# Patient Record
Sex: Female | Born: 1980 | Race: White | Marital: Single | Smoking: Never smoker
Health system: Southern US, Community
[De-identification: ages and names within clinical notes are randomized; demographics above are authoritative.]

---

## 2019-02-03 ENCOUNTER — Ambulatory Visit
Admission: RE | Admit: 2019-02-03 | Discharge: 2019-02-03 | Disposition: A | Discharge status: Home / Self Care | Payer: No Typology Code available for payment source | Source: Ambulatory Visit | Attending: Family Medicine | Admitting: Family Medicine

## 2019-02-03 ENCOUNTER — Other Ambulatory Visit: Payer: Self-pay

## 2019-02-03 ENCOUNTER — Ambulatory Visit: Payer: Self-pay | Admitting: Family Medicine

## 2019-02-03 ENCOUNTER — Encounter: Payer: Self-pay | Admitting: Family Medicine

## 2019-02-03 VITALS — BP 104/60 | HR 76 | Temp 98.5°F | Ht 64.0 in | Wt 191.6 lb

## 2019-02-03 DIAGNOSIS — Z0000 Encounter for general adult medical examination without abnormal findings: Secondary | ICD-10-CM

## 2019-02-03 DIAGNOSIS — Y9315 Activity, underwater diving and snorkeling: Secondary | ICD-10-CM

## 2019-02-03 LAB — POCT HEMOGLOBIN: Hemoglobin: 13.5 g/dL (ref 11–14.6)

## 2019-02-03 LAB — POCT UA - GLUCOSE/PROTEIN
GLUCOSE UA: NEGATIVE
Protein, UA: NEGATIVE

## 2019-02-03 LAB — GLUCOSE, POCT (MANUAL RESULT ENTRY): POC Glucose: 86 mg/dl (ref 70–99)

## 2019-02-03 NOTE — Patient Instructions (Signed)
It was great to meet you today!  Go for your chest xray.    I'll let you know the results when you get back.

## 2019-02-03 NOTE — Progress Notes (Signed)
S:    Patient arrives in good spirits and ambulating without assistance.    Presents for lung function evaluation for "dive physical". Patient reports breathing has been good.   Denies history or breathing difficulty.   O: Patient provided good effort while attempting spirometry.  FVC 3.38    Calculated Lower Limit for [employer] Diving Standards   FVC = 3.05 FEV1 2.68       Calculated Lower Limit for [employer] Diving Standards   FEV1= 2.51 FEV1/FVC  79.4   Calculated Lower Limit for [employer] Diving Standards   FEV1/FVC = 73.3  See "scanned report" or Documentation Flowsheet (discrete results - PFTs) for  Spirometry results and copy of evaluation.   A/P: PICK 1 OF THE 2 FOLLOWING (DELETE the others AND THEN DELETE THIS LINE OF TEXT) Spirometry evaluation without bronchodilator reveals normal lung function.  FEV1, FVC and FEV1/FVC ratio all exceed threshold for spirometric parameters. Reviewed results of pulmonary function tests.  Pt verbalized understanding of results and education.

## 2019-02-05 ENCOUNTER — Encounter: Payer: Self-pay | Admitting: Family Medicine

## 2019-02-05 NOTE — Assessment & Plan Note (Addendum)
Vision (distance, near, color), hearing, UA, CBG, Hgb, and Spirometry all within normal limits. Normal CXR within past 5 years -- performed today. EKG:  N/a due age and no cardiac risk factors Coronary assessment:  n/a Approval for SCUBA diving, I find no medical conditions considered incompatible with diving. Due to age and lack of medical conditions, patient qualifies for 2 year certification.

## 2019-02-05 NOTE — Progress Notes (Signed)
Subjective:    Breanna Cunningham is a 38 y.o. female who presents to [facility] today for scuba diving physical for the [facility]:  1.  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  Denies any complications or injuries while diving.  Has never failed a fitness to dive physical.  Currently well, without complaints.  The following portions of the patient's history were reviewed and updated as appropriate: allergies, current medications, past medical history, family and social history, and problem list.  PMH reviewed.  No past medical history on file.  Medications reviewed. No current outpatient medications on file.   No current facility-administered medications for this visit.     PMH:   - denies - No other hospitalizations or other prior medical history   PSH: - denies  Family History: - No family history of syncope or cardiac issues.    Social: - Never smoker - Denies illicit drug use - Very occasional social drinker (1-2 drinks)  SCUBA ROS:  He denies any history of middle ear trauma/disease, vertigo, ocular surgery, asthma or other respiratory issues, seizures, loss of consciousness, recurring neurologic disorders, history of head injury, coagulopathies, evidence of CAD or other structural heart disease, pneumothorax, diabetes, or exercise intolerance.    General ROS:  The patient denies fever, unusual weight change, decreased hearing, chest pain, palpitations, pre-syncopal or syncopal episodes, dyspnea on exertion, prolonged cough, hemoptysis, change in bowel habits, melena, hematochezia, severe indigestion/heartburn, nausea/vomiting/abdominal pain, genital sores, muscle weakness, difficulty walking, abnormal bleeding, or enlarged lymph nodes.     Objective:   Physical Exam BP 104/60   Pulse 76   Temp 98.5 F (36.9 C) (Oral)   Ht 5\' 4"  (1.626 m)   Wt 191 lb 9.6 oz (86.9 kg)   SpO2 97%   BMI 32.89 kg/m  Gen:  Alert,  cooperative patient who appears stated age in no acute distress.  Vital signs reviewed. Head:  /AT Eyes:  Fundoscopy WNL BL.  PERRL, EOMI Ears:  External ears WNL, Bilateral TM's normal without retraction, redness or bulging.  Canals clear BL  Mouth:  Good dental hygiene. Tonsils non-erythematous, non-edematous.   MMM Neck:  Trachea midline Cardiac:  Regular rate and rhythm without murmur auscultated.   Pulm:  Clear to auscultation bilaterally with good air movement throuhout.  No wheezes or rales noted.   Abd:  Soft/nondistended/nontender.  Good bowel sounds throughout all four quadrants.  No masses noted.  Exts: No edema BL LE's, warm and well-perfused Neuro:  Alert and oriented to person, place, and date.  CN II-XII intact.  Sensation intact to light touch and vibration bilateral upper and lower extremities equally.  Motor function equal and strength 5/5 bilateral upper and lower extremities.  Normal gait.  DTRs +2 BL tricep, brachialis, patellar, and achilles.  Finger to nose cerebellar testing within normal limits.  Color vision testing normal. Psych:  Not depressed or anxious appearing.  Linear and coherent thought process as evidenced by speech pattern. Smiles spontaneously.   Results for orders placed or performed in visit on 02/03/19 (from the past 72 hour(s))  Urinalysis - Glucose/Protein     Status: None   Collection Time: 02/03/19  9:05 AM  Result Value Ref Range   Glucose, UA Negative Negative   Protein, UA Negative Negative  Hemoglobin     Status: None   Collection Time: 02/03/19  9:18 AM  Result Value Ref Range   Hemoglobin 13.5 11 - 14.6  g/dL  Glucose (CBG)     Status: None   Collection Time: 02/03/19  9:18 AM  Result Value Ref Range   POC Glucose 86 70 - 99 mg/dl

## 2020-06-14 IMAGING — CR CHEST - 2 VIEW
2 series · 2 of 2 positions shown · non-contrast
Comparison: None.

CLINICAL DATA: Physical examination for scuba diving

EXAM:
CHEST - 2 VIEW

[w chest pa]
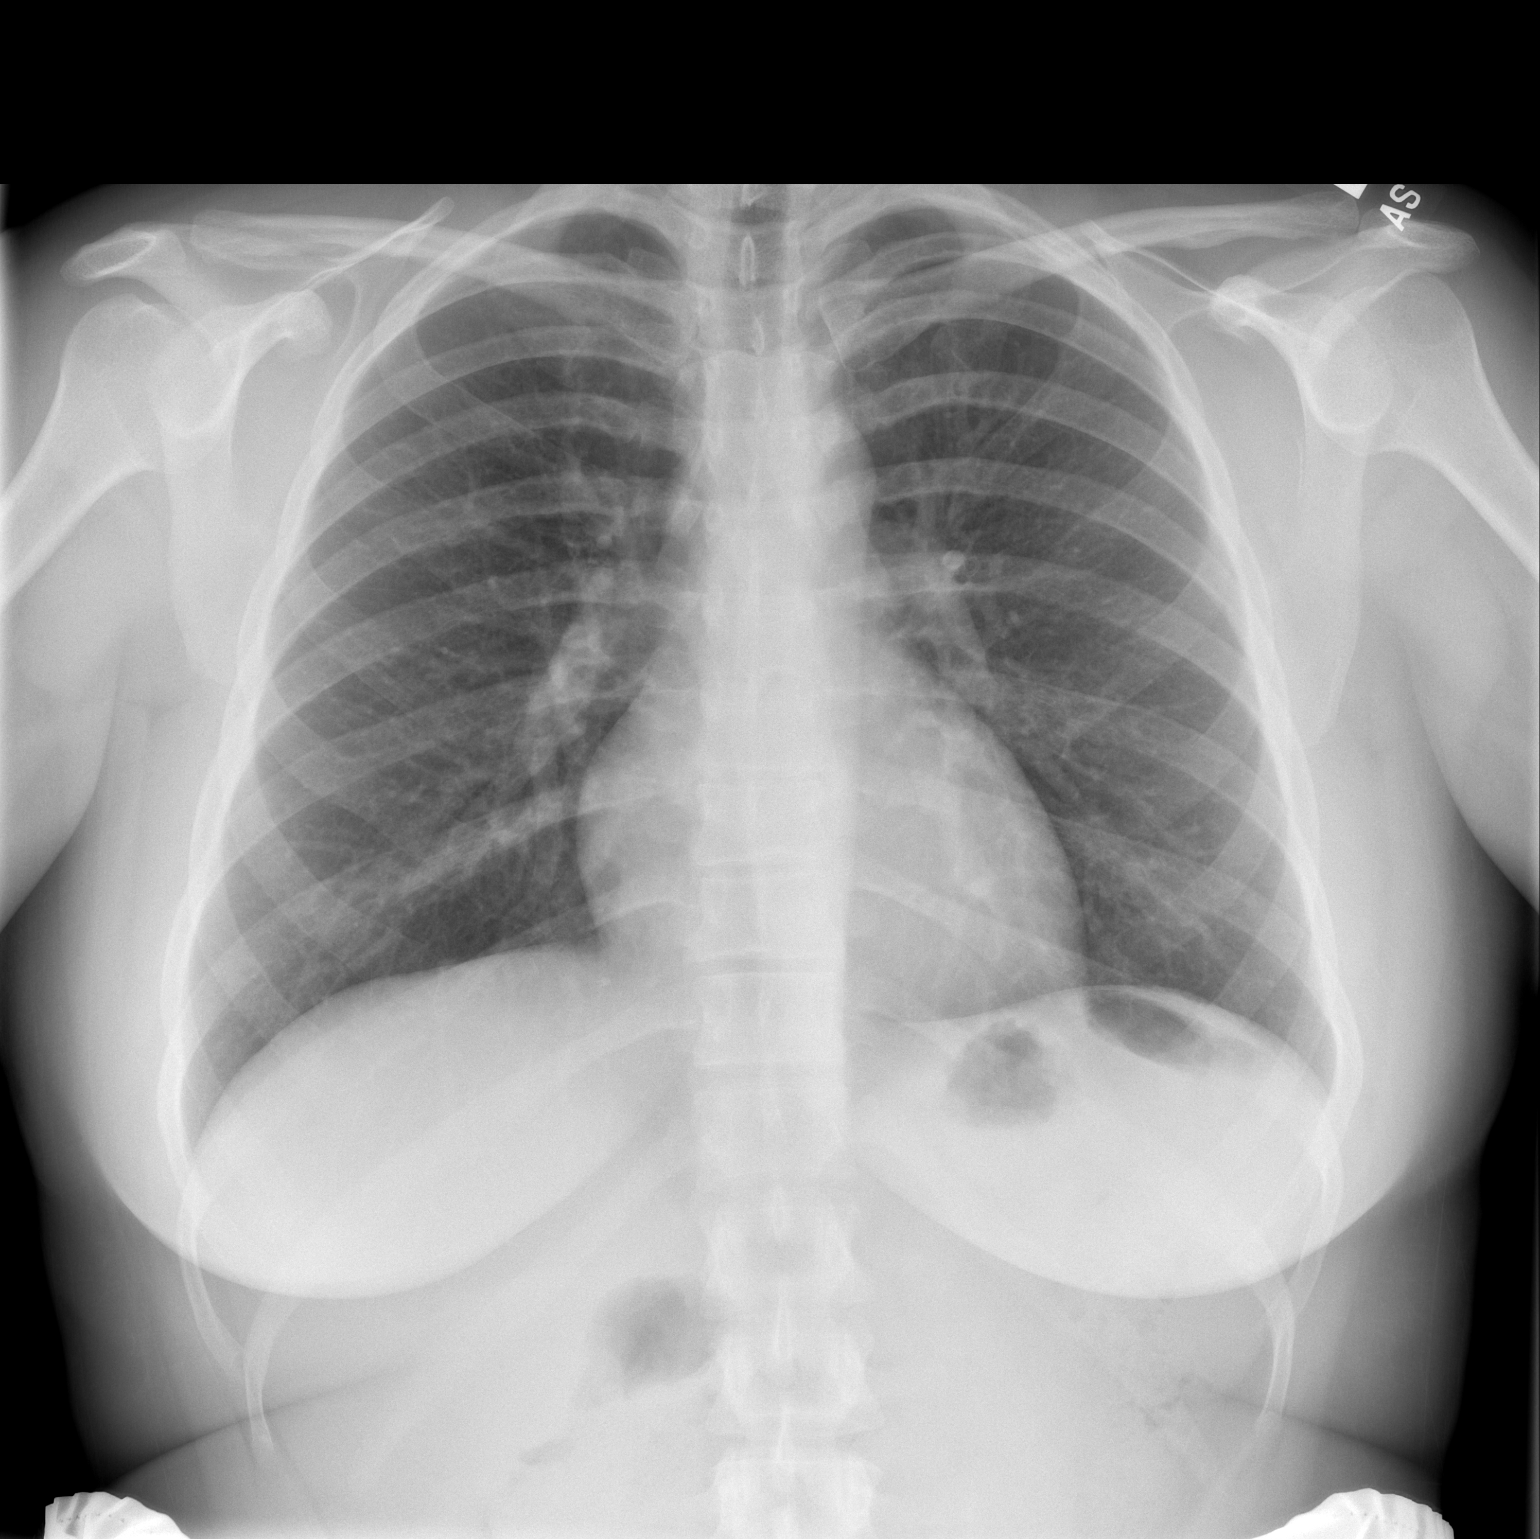

[w chest lat]
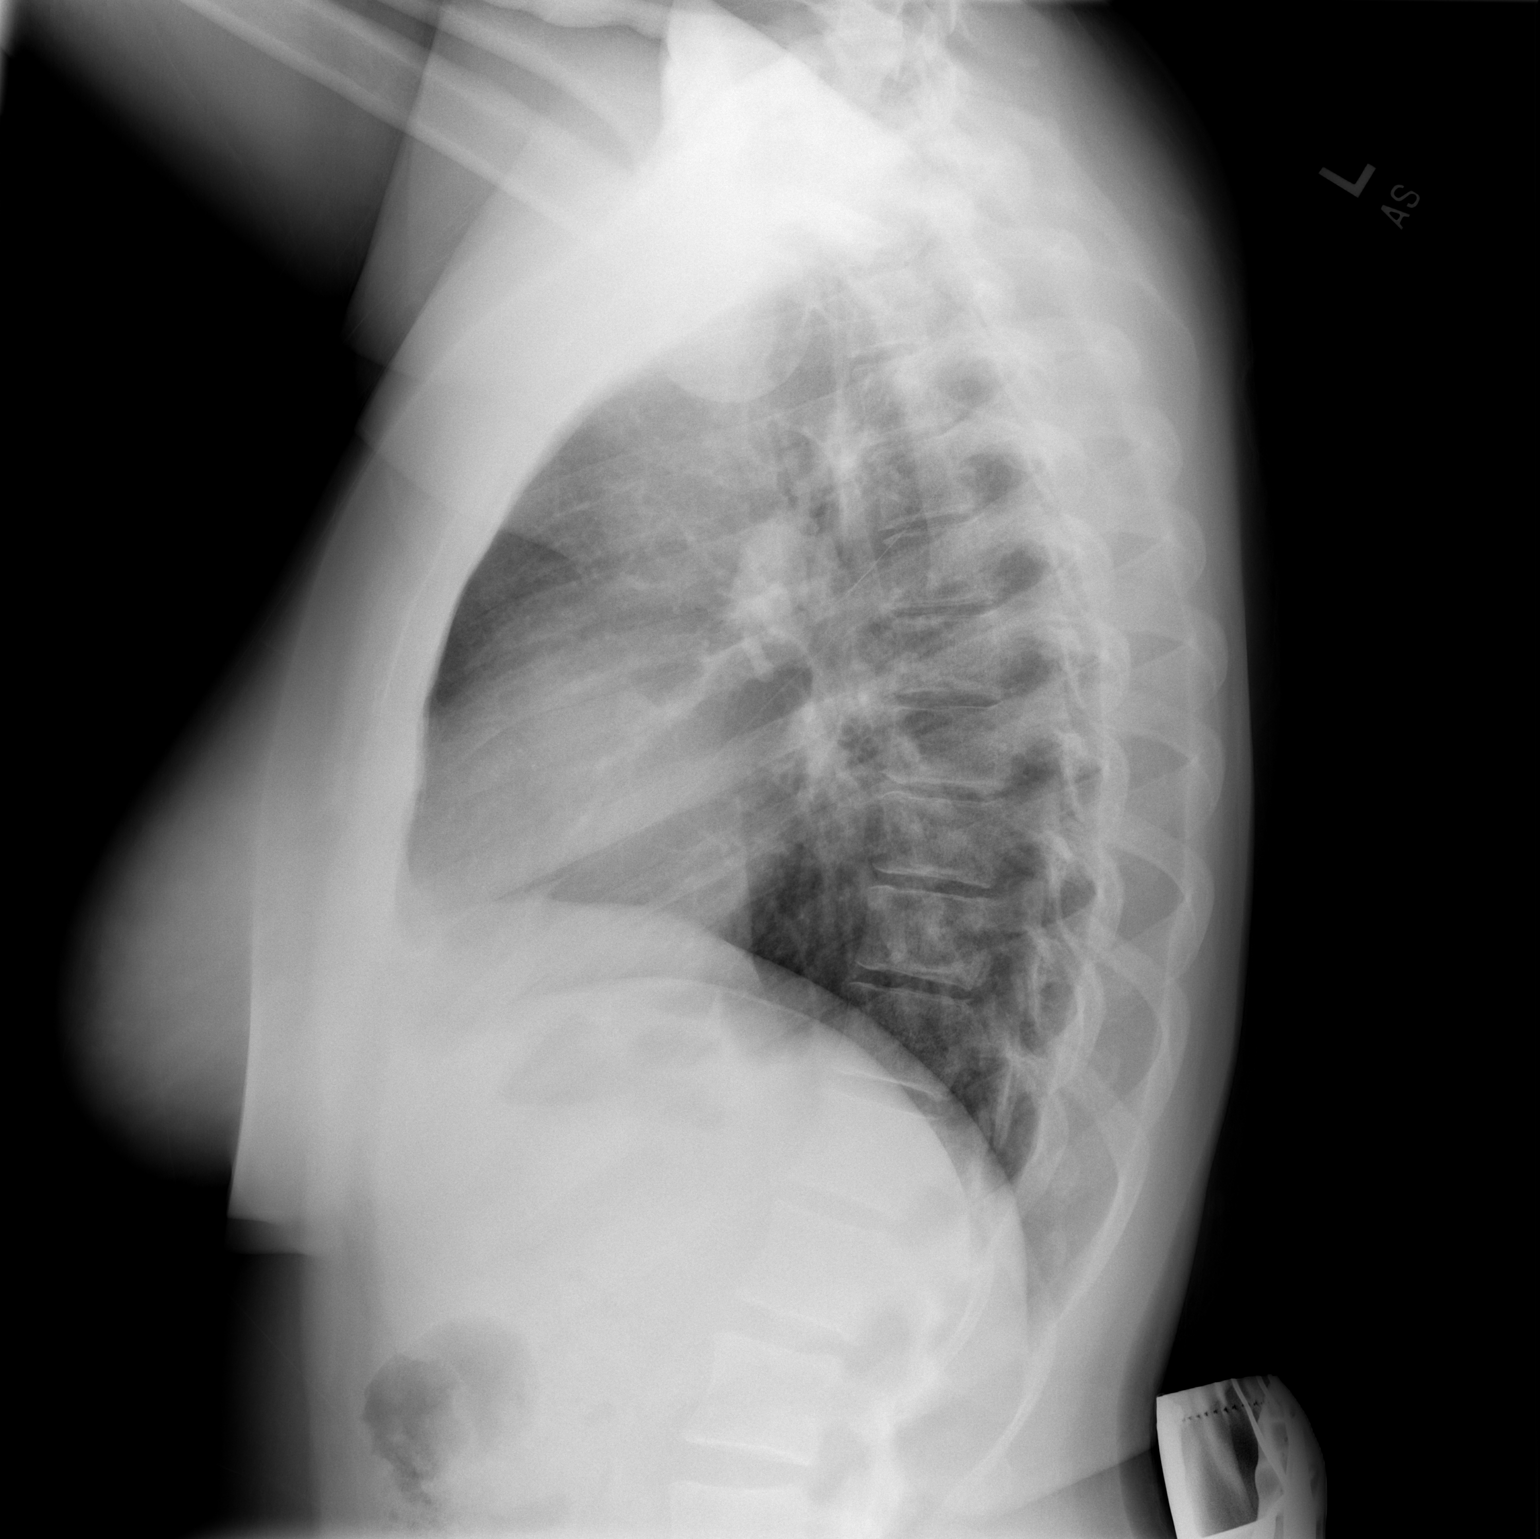

[2 of 2 positions shown; findings below may reference images not displayed]

FINDINGS: Lungs are clear. Heart size and pulmonary vascularity are normal. No
adenopathy. No bone lesions.
IMPRESSION: No abnormality noted.
